# Patient Record
Sex: Male | Born: 1955 | Race: White | Hispanic: No | Marital: Married | Smoking: Never smoker
Health system: Southern US, Community
[De-identification: ages and names within clinical notes are randomized; demographics above are authoritative.]

## PROBLEM LIST (undated history)

## (undated) DIAGNOSIS — M109 Gout, unspecified: Secondary | ICD-10-CM

## (undated) DIAGNOSIS — E119 Type 2 diabetes mellitus without complications: Secondary | ICD-10-CM

## (undated) HISTORY — PX: CLAVICLE SURGERY: SHX598

---

## 2001-02-16 ENCOUNTER — Emergency Department (HOSPITAL_COMMUNITY): Admission: EM | Admit: 2001-02-16 | Discharge: 2001-02-16 | Payer: Self-pay

## 2002-07-08 ENCOUNTER — Emergency Department (HOSPITAL_COMMUNITY): Admission: EM | Admit: 2002-07-08 | Discharge: 2002-07-08 | Payer: Self-pay | Admitting: Emergency Medicine

## 2005-02-07 ENCOUNTER — Ambulatory Visit: Payer: Self-pay | Admitting: Family Medicine

## 2005-02-07 ENCOUNTER — Inpatient Hospital Stay (HOSPITAL_COMMUNITY): Admission: EM | Admit: 2005-02-07 | Discharge: 2005-02-09 | Payer: Self-pay | Admitting: Emergency Medicine

## 2005-03-13 ENCOUNTER — Ambulatory Visit: Payer: Self-pay | Admitting: Family Medicine

## 2007-04-16 ENCOUNTER — Emergency Department (HOSPITAL_COMMUNITY): Admission: EM | Admit: 2007-04-16 | Discharge: 2007-04-16 | Payer: Self-pay | Admitting: Emergency Medicine

## 2007-07-05 ENCOUNTER — Emergency Department (HOSPITAL_COMMUNITY): Admission: EM | Admit: 2007-07-05 | Discharge: 2007-07-05 | Payer: Self-pay | Admitting: Family Medicine

## 2008-08-27 ENCOUNTER — Ambulatory Visit: Admission: RE | Admit: 2008-08-27 | Discharge: 2008-08-27 | Payer: Self-pay | Admitting: Hematology & Oncology

## 2008-10-12 ENCOUNTER — Emergency Department (HOSPITAL_COMMUNITY): Admission: EM | Admit: 2008-10-12 | Discharge: 2008-10-13 | Payer: Self-pay | Admitting: Emergency Medicine

## 2010-11-14 NOTE — Procedures (Signed)
NAME:  Bryan Gibbs, Bryan Gibbs               ACCOUNT NO.:  [id number]   MEDICAL RECORD NO.:  [id number]          PATIENT TYPE:  OUT   LOCATION:  SLEE                          FACILITY:  [facility]   PHYSICIAN:  Kofi A. Gerilyn Pilgrim, M.D. DATE OF BIRTH:  [birth year]   DATE OF PROCEDURE:  08/27/2008  DATE OF DISCHARGE:  08/27/2008                             SLEEP DISORDER REPORT   NOCTURNAL POLYSOMNOGRAPHY REPORT   REFERRING PHYSICIAN:  Kinnie Scales. Annalee Genta, MD   INDICATIONS:  This is a 55 year old who presents with hypersomnia  snoring and is being evaluated for obstructive sleep apnea syndrome.   MEDICATIONS:  Allopurinol.   Epworth sleepiness scale 11.  BMI 36.   ARCHITECTURAL SUMMARY:  This is a split night study with the first part being a diagnostic and  second part a titration.  The total recording time is 447 minutes.  Sleep efficiency 83%.  Sleep latency 8 minutes.  REM latency 244  minutes.   RESPIRATORY SUMMARY:  The baseline oxygen saturation 94%.  Lowest  saturation 80%.  The diagnostic AHI is 92.  The patient was titrated  between pressures of 5 CPAP, which he initially tolerated but  subsequently had to be switched over to bilevel pressures because of  intolerance.  The optimal pressure is 18/14.   LIMB MOVEMENT SUMMARY:  PLM index zero.   ELECTROCARDIOGRAM SUMMARY:  Average heart rate 79 with no significant  dysrhythmias observed.   IMPRESSION:  Severe obstructive sleep apnea syndrome, which responded  well to a bilevel pressure of 18/14.   Thanks for this referral      Kofi A. Gerilyn Pilgrim, M.D.  Electronically Signed     KAD/MEDQ  D:  09/04/2008  T:  09/05/2008  Job:  [id number]
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
## 2010-11-17 NOTE — Discharge Summary (Signed)
NAME:  Bryan Gibbs, Bryan Gibbs               ACCOUNT NO.:  [id number]   MEDICAL RECORD NO.:  [id number]          PATIENT TYPE:  INP   LOCATION:  5729                         FACILITY:  [facility]   PHYSICIAN:  Henri Medal, MDDATE OF BIRTH:  February 08, 1956   DATE OF ADMISSION:  02/07/2005  DATE OF DISCHARGE:  02/09/2005                                 DISCHARGE SUMMARY   DISCHARGE DIAGNOSES:  1.  Partial small bowel obstruction.  2.  Obstructive sleep apnea.   DISCHARGE MEDICATIONS:  Tylenol 500 mg q.4-6h. p.r.n. pain.   PROCEDURES:  1.  Abdominal CT:  Mild paroxysmal mid small bowel distention with relative      decompression of the distal small bowel and colon.  Nonfocal transition      point is identified.  Findings may reflect a partial small bowel      obstruction or focal ileus.  No intra-abdominal inflammatory changes      identified.  The patient's gallbladder was not visualized.  2.  Pelvic CT:  No focal peri-inflammatory changes or masses.   HOSPITAL COURSE:  A 55 year old white male with a two day history of  diarrhea, nonbloody, nonmucoid, non-foul-smelling.  The patient also  complained of persistent vomiting which started after the diarrhea and  epigastric abdominal pain described as dull, aching, constant, severity  8/10, worse on laying flat and on eating.  The pain radiated to the  supraumbilical area, both on the left and the right side.  Patient was  unable to keep fluids down for the past two days.  The patient also  complained of weakness, dizziness, and lightheadedness.  The patient was  seen at [facility] and noted to be orthostatic and  tachycardic with diffuse abdominal tenderness.  Patient was sent to [facility] emergency for evaluation.   PHYSICAL EXAMINATION:  VITAL SIGNS:  Blood pressure 136/90, heart rate 106,  respiratory rate 20, O2 sat 93% on room air.  GENERAL:  The patient was in no acute distress.  Oriented x3.  ABDOMEN:  Decreased  bowel sounds with tenderness on deep palpation in the  epigastric area, right lower quadrant with question of rebound tenderness.  No guarding.  No masses palpable.  No psoas or obturator tenderness.   LABORATORY ON ADMISSION:  Hemoglobin 18.7, hematocrit 52.1, WBC 6.2,  platelets 239.  Stool guaiac was positive.  Stool WBC was positive with (one  cell).   PROBLEM LIST:  1.  For the small bowel obstruction, an NG tube was placed, and patient was      made n.p.o.  The pain was managed with morphine IV.  Phenergan p.r.n.      for vomiting.  Abdominal CT scan showed SBO (partial versus ileus).      This clinical presentation for this patient was most likely secondary to      a viral gastroenteritis causing the SBO, partial.  Stools were initially      positive for SBO, second sample negative.  The patient responded      successfully with medical management of SBO.  On the day  of discharge,      the patient was stable, tolerating p.o. intake.  Denied nausea,      vomiting, diarrhea, abdominal pain.  The patient was DC'd home with a      follow-up appointment with Dr. Tanya Nones.  Advised to consult GI MD, if      symptoms recur.  2.  Dehydration:  Secondary to vomiting, diarrhea, and decreased p.o. intake      for two days, resolved with IV fluid therapy.  3.  Obstructive sleep apnea:  The patient refused to use oxygen during the      night.  This issue to be followed by primary physician.    Lendon Colonel  D:  02/09/2005  T:  02/09/2005  Job:  [id number]   cc:   Broadus John T. Pamalee Leyden, MD  Fax: [phone]   Stan Head. Cleta Alberts, M.D.  [address]  [place]  [place] [address]  Fax: [phone]

## 2010-11-17 NOTE — H&P (Signed)
NAME:  Bryan Gibbs, Bryan Gibbs               ACCOUNT NO.:  [id number]   MEDICAL RECORD NO.:  [id number]          PATIENT TYPE:  INP   LOCATION:  1824                         FACILITY:  [facility]   PHYSICIAN:  Leighton Roach McDiarmid, M.D.DATE OF BIRTH:  October 14, 1955   DATE OF ADMISSION:  02/07/2005  DATE OF DISCHARGE:                                HISTORY & PHYSICAL   HISTORY OF PRESENT ILLNESS:  This is a 55 year old white male who presents  with a 2-day history of diarrhea, soft to watery stools described as  yellowish, non-bloody, non-mucoid, non-foul smelling.  The stools initially  were occurring every hour.  This was followed by persistent vomiting of  watery to semi-solid non-bilious material and epigastric abdominal pain  described as dull, aching, constant, with severity of 8/10, worse on lying  flat and on eating.  The pain was noted to radiate to the supraumbilical  area, both on the left and the right side.  The patient was unable to keep  down any fluids the past 2 days, except for a half cup of orange juice.  The  patient felt weak, dizzy, and lightheaded.  He was seen at the [facility] and noted to be orthostatic and tachycardic with  diffuse abdominal tenderness.  The patient was sent to the [facility]  emergency department for evaluation.   PAST MEDICAL HISTORY:  1.  Gout.  2.  Obstructive sleep apnea on CPAP at night.   MEDICATIONS:  Gout medication, which the patient cannot recall.   ALLERGIES:  No known drug allergies.   FAMILY HISTORY:  Coronary artery disease in the father who died at 84 years  of age because of a myocardial infarction.  Hypertension in the mother.  CVA  in both grandparents.   SOCIAL HISTORY:  The patient is married with 3 children.  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  The patient has occasional alcohol intake.   REVIEW OF SYSTEMS:  There is no history of fever, hematochezia, melena,  jaundice, dysuria, frequency, recent  antibiotic use.   PHYSICAL EXAMINATION:  VITAL SIGNS:  Blood pressure of 136/90, heart rate of  106, respiratory rate 20, O2 saturation of 93% on room air.  GENERAL:  This is a pleasant white male lying on the bed, comfortable, in no  distress.  HEENT:  Pink conjunctivae.  Anicteric sclerae.  No tonsillar or pharyngeal  congestion with note of dry oral mucosa.  NECK:  No thyromegaly.  No cervical lymph.  CVS:  __________.  Distinct heart sounds.  Tachycardic.  No murmurs.  RESPIRATORY:  Clear to auscultation bilaterally.  No crackles, no wheezes.  ABDOMEN:  There is a note of decreased bowel sounds with tenderness on deep  palpation in the epigastric area in the right lower quadrant with  questionable rebound tenderness.  No guarding.  No psoas or obturator  tenderness.  No masses palpable.  EXTREMITIES:  No edema, cyanosis, with warm, well-perfused skin.  NEUROLOGIC:  Cranial nerves II-XII intact.  Motor strength 5/5 in both upper  and lower extremities.  Sensory  intact.   LABORATORY DATA:  Hemoglobin 18.7, hematocrit 52.1, WBC's 6.2, platelets  239.  Stool guaiac positive.  Stool WBC positive, 1 cell.   ASSESSMENT:  A 55 year old white male admitted for vomiting and diarrhea  with dehydration and abdominal pain.  1.  Diarrhea and vomiting, probably secondary to acute gastroenteritis with      dehydration.  Will given IV bolus and maintain IV fluids.  Will check      stool cultures and CMET.  Will give patient Phenergan 25 mg p.r.n. for      vomiting.  2.  Abdominal pain.  This is probably secondary to acute gastroenteritis.      However, on examination, there is some localization of the pain to the      epigastric area.  Concern for possible peptic ulcer, pancreatitis, or      gastritis.  Will check amylase, lipase, and do an abdominal series to      rule out perforated ulcer.  At this point, there is no indication to do      a CT of the abdomen; however, if the patient's abdominal  findings      progress in severity, we will need to evaluate the patient and assess      the need for further imaging.  3.  Obstructive sleep apnea.  Continue CPAP at home settings.  4.  Fluids, electrolytes, and nutrition.  NPO temporarily.  IVF NSS 500 cc      bolus then to run at 150 cc per hour.   Discussed with the patient the need for admission.  Addressed his questions  and concerns.      M   MC/MEDQ  D:  02/07/2005  T:  02/07/2005  Job:  [id number]   cc:   Brett Canales A. Cleta Alberts, M.D.  [address]  [place]  [place] [address]  Fax: [phone]

## 2011-04-11 LAB — URINALYSIS, ROUTINE W REFLEX MICROSCOPIC
Bilirubin Urine: NEGATIVE
Ketones, ur: 15 — AB
Nitrite: NEGATIVE
Protein, ur: NEGATIVE
Specific Gravity, Urine: 1.023
Urobilinogen, UA: 0.2

## 2011-04-11 LAB — URINE MICROSCOPIC-ADD ON

## 2013-11-23 ENCOUNTER — Encounter (HOSPITAL_COMMUNITY): Payer: Self-pay | Admitting: Emergency Medicine

## 2013-11-23 ENCOUNTER — Emergency Department (HOSPITAL_COMMUNITY)
Admission: EM | Admit: 2013-11-23 | Discharge: 2013-11-23 | Disposition: A | Discharge status: Home / Self Care | Payer: BC Managed Care – PPO | Attending: Emergency Medicine | Admitting: Emergency Medicine

## 2013-11-23 ENCOUNTER — Emergency Department (HOSPITAL_COMMUNITY): Payer: BC Managed Care – PPO

## 2013-11-23 DIAGNOSIS — X58XXXA Exposure to other specified factors, initial encounter: Secondary | ICD-10-CM | POA: Insufficient documentation

## 2013-11-23 DIAGNOSIS — S60229A Contusion of unspecified hand, initial encounter: Secondary | ICD-10-CM | POA: Insufficient documentation

## 2013-11-23 DIAGNOSIS — E119 Type 2 diabetes mellitus without complications: Secondary | ICD-10-CM | POA: Insufficient documentation

## 2013-11-23 DIAGNOSIS — T148XXA Other injury of unspecified body region, initial encounter: Secondary | ICD-10-CM

## 2013-11-23 DIAGNOSIS — Y93H2 Activity, gardening and landscaping: Secondary | ICD-10-CM | POA: Insufficient documentation

## 2013-11-23 DIAGNOSIS — Y92009 Unspecified place in unspecified non-institutional (private) residence as the place of occurrence of the external cause: Secondary | ICD-10-CM | POA: Insufficient documentation

## 2013-11-23 HISTORY — DX: Gout, unspecified: M10.9

## 2013-11-23 HISTORY — DX: Type 2 diabetes mellitus without complications: E11.9

## 2013-11-23 NOTE — ED Provider Notes (Signed)
CSN: [id number]     Arrival date & time 11/23/13  1713 History  This chart was scribed for non-physician practitioner Kerrie Buffalo, NP, working with Gilda Crease, MD, by Yevette Edwards, ED Scribe. This patient was seen in room APFT22/APFT22 and the patient's care was started at 6:40 PM.   None    Chief Complaint  Patient presents with  . Hand Pain   Patient is a 58 y.o. male presenting with hand pain. The history is provided by the patient. No language interpreter was used.  Hand Pain This is a new problem. The current episode started 3 to 5 hours ago. The problem occurs constantly. The problem has not changed since onset.Nothing aggravates the symptoms. Nothing relieves the symptoms. He has tried a cold compress for the symptoms.   HPI Comments: PENN ALKINS is a 58 y.o. male, with a h/o gout and DM, who presents to the Emergency Department complaining of acute swelling to his right hand which began today. He states he has not experienced pain to the hand except for when pressure is applied. The pt was working outside today, but he denies known bites or injuries to his hand. He did wash 2 cars and used a tiller to plant his garden.  He has used ice with minimal resolution. The pt is a truck-driver by trade.   Past Medical History  Diagnosis Date  . Gout   . Diabetes mellitus without complication    History reviewed. No pertinent past surgical history. History reviewed. No pertinent family history. History  Substance Use Topics  . Smoking status: Never Smoker   . Smokeless tobacco: Not on file  . Alcohol Use: No    Review of Systems  negative except as stated in HPI  Allergies  Review of patient's allergies indicates no known allergies.  Home Medications   Prior to Admission medications   Not on File   Physical Exam  Nursing note and vitals reviewed. Constitutional: He is oriented to person, place, and time. He appears well-developed and well-nourished. No  distress.  HENT:  Head: Normocephalic and atraumatic.  Eyes: EOM are normal.  Neck: Neck supple. No tracheal deviation present.  Cardiovascular: Normal rate.   Good pulses and good circulation.   Pulmonary/Chest: Effort normal. No respiratory distress.  Musculoskeletal: Normal range of motion.       Right hand: He exhibits tenderness and swelling. He exhibits normal range of motion, no bony tenderness, normal capillary refill and no laceration. Normal sensation noted. Normal strength noted.       Hands: Neurological: He is alert and oriented to person, place, and time.  Good strength and grip.   Skin: Skin is warm and dry. No erythema.  No warmth present to right hand. No erythema present to right hand.   Psychiatric: He has a normal mood and affect. His behavior is normal.    ED Course  Procedures (including critical care time)   COORDINATION OF CARE:  6:53 PM- Discussed treatment plan with patient, and the patient agreed to the plan. The plan includes usage of IBU and continual use of ice, wrap, and elevation. Advised pt to return to ED if he develops numbness to his fingers.   Labs Review Labs Reviewed - No data to display  Imaging Review Dg Hand Complete Right  11/23/2013   CLINICAL DATA:  Hand swelling and pain.  No known injury.  EXAM: RIGHT HAND - COMPLETE 3+ VIEW  COMPARISON:  None.  FINDINGS: No fracture  or dislocation.  There arthropathic changes most evident involving the DIP joints, greatest at the index finger, with asymmetric joint space narrowing, minor subchondral sclerosis and small marginal osteophytes.  There is mild soft tissue swelling of the fingers adjacent to the DIP joints.  IMPRESSION: No fracture or acute finding.  Osteoarthritis.   Electronically Signed   By: Amie Portlandavid  Ormond M.D.   On: 11/23/2013 17:42    MDM  58 y.o. male with swelling and bruising to the dorsum of the right hand. Ace wrap applied, ice, elevation and Advil. Stable for discharge without  neurovascular deficits. He will return for any problems. Discussed in detail signs of vascular compromise and reasons to return. Patient voices understanding.   Final diagnoses:  Hematoma   I personally performed the services described in this documentation, which was scribed in my presence. The recorded information has been reviewed and is accurate.   [employer] G [employer] LLCope Orlene OchM Humberto Addo, [place]NP 11/24/13 0025

## 2013-11-23 NOTE — ED Notes (Addendum)
Swelling of rt hand , had been outside working and felt  Burning sensation, , Lg swelling present.  Ice pack applied

## 2013-11-26 NOTE — ED Provider Notes (Signed)
Medical screening examination/treatment/procedure(s) were performed by non-physician practitioner and as supervising physician I was immediately available for consultation/collaboration.   EKG Interpretation None        Christopher J. Pollina, MD 11/26/13 1501 

## 2016-06-22 DIAGNOSIS — R0602 Shortness of breath: Secondary | ICD-10-CM | POA: Diagnosis not present

## 2016-06-22 DIAGNOSIS — R05 Cough: Secondary | ICD-10-CM | POA: Diagnosis not present

## 2016-06-22 DIAGNOSIS — J189 Pneumonia, unspecified organism: Secondary | ICD-10-CM | POA: Diagnosis not present

## 2016-07-01 DIAGNOSIS — R05 Cough: Secondary | ICD-10-CM | POA: Diagnosis not present

## 2016-07-01 DIAGNOSIS — J018 Other acute sinusitis: Secondary | ICD-10-CM | POA: Diagnosis not present

## 2016-08-14 DIAGNOSIS — G4733 Obstructive sleep apnea (adult) (pediatric): Secondary | ICD-10-CM | POA: Diagnosis not present

## 2016-08-14 DIAGNOSIS — E1165 Type 2 diabetes mellitus with hyperglycemia: Secondary | ICD-10-CM | POA: Diagnosis not present

## 2016-08-14 DIAGNOSIS — E782 Mixed hyperlipidemia: Secondary | ICD-10-CM | POA: Diagnosis not present

## 2016-08-14 DIAGNOSIS — I10 Essential (primary) hypertension: Secondary | ICD-10-CM | POA: Diagnosis not present

## 2016-08-16 DIAGNOSIS — E782 Mixed hyperlipidemia: Secondary | ICD-10-CM | POA: Diagnosis not present

## 2016-08-16 DIAGNOSIS — E1165 Type 2 diabetes mellitus with hyperglycemia: Secondary | ICD-10-CM | POA: Diagnosis not present

## 2016-08-16 DIAGNOSIS — M1000 Idiopathic gout, unspecified site: Secondary | ICD-10-CM | POA: Diagnosis not present

## 2016-08-16 DIAGNOSIS — G4733 Obstructive sleep apnea (adult) (pediatric): Secondary | ICD-10-CM | POA: Diagnosis not present

## 2016-11-06 DIAGNOSIS — H6122 Impacted cerumen, left ear: Secondary | ICD-10-CM | POA: Diagnosis not present

## 2016-11-06 DIAGNOSIS — Z6832 Body mass index (BMI) 32.0-32.9, adult: Secondary | ICD-10-CM | POA: Diagnosis not present

## 2016-11-06 DIAGNOSIS — H9202 Otalgia, left ear: Secondary | ICD-10-CM | POA: Diagnosis not present

## 2016-12-18 DIAGNOSIS — E782 Mixed hyperlipidemia: Secondary | ICD-10-CM | POA: Diagnosis not present

## 2016-12-18 DIAGNOSIS — R5383 Other fatigue: Secondary | ICD-10-CM | POA: Diagnosis not present

## 2016-12-18 DIAGNOSIS — E1165 Type 2 diabetes mellitus with hyperglycemia: Secondary | ICD-10-CM | POA: Diagnosis not present

## 2016-12-18 DIAGNOSIS — I10 Essential (primary) hypertension: Secondary | ICD-10-CM | POA: Diagnosis not present

## 2016-12-18 DIAGNOSIS — Z9189 Other specified personal risk factors, not elsewhere classified: Secondary | ICD-10-CM | POA: Diagnosis not present

## 2016-12-24 DIAGNOSIS — Z1389 Encounter for screening for other disorder: Secondary | ICD-10-CM | POA: Diagnosis not present

## 2016-12-24 DIAGNOSIS — G4733 Obstructive sleep apnea (adult) (pediatric): Secondary | ICD-10-CM | POA: Diagnosis not present

## 2016-12-24 DIAGNOSIS — E782 Mixed hyperlipidemia: Secondary | ICD-10-CM | POA: Diagnosis not present

## 2016-12-24 DIAGNOSIS — M1000 Idiopathic gout, unspecified site: Secondary | ICD-10-CM | POA: Diagnosis not present

## 2016-12-24 DIAGNOSIS — E1165 Type 2 diabetes mellitus with hyperglycemia: Secondary | ICD-10-CM | POA: Diagnosis not present

## 2017-06-06 DIAGNOSIS — I10 Essential (primary) hypertension: Secondary | ICD-10-CM | POA: Diagnosis not present

## 2017-06-06 DIAGNOSIS — E782 Mixed hyperlipidemia: Secondary | ICD-10-CM | POA: Diagnosis not present

## 2017-06-06 DIAGNOSIS — E1165 Type 2 diabetes mellitus with hyperglycemia: Secondary | ICD-10-CM | POA: Diagnosis not present

## 2017-06-06 DIAGNOSIS — M1000 Idiopathic gout, unspecified site: Secondary | ICD-10-CM | POA: Diagnosis not present

## 2017-06-13 DIAGNOSIS — I10 Essential (primary) hypertension: Secondary | ICD-10-CM | POA: Diagnosis not present

## 2017-06-13 DIAGNOSIS — E1165 Type 2 diabetes mellitus with hyperglycemia: Secondary | ICD-10-CM | POA: Diagnosis not present

## 2017-06-13 DIAGNOSIS — E782 Mixed hyperlipidemia: Secondary | ICD-10-CM | POA: Diagnosis not present

## 2017-06-13 DIAGNOSIS — Z23 Encounter for immunization: Secondary | ICD-10-CM | POA: Diagnosis not present

## 2017-06-13 DIAGNOSIS — M1000 Idiopathic gout, unspecified site: Secondary | ICD-10-CM | POA: Diagnosis not present

## 2017-06-13 DIAGNOSIS — Z0001 Encounter for general adult medical examination with abnormal findings: Secondary | ICD-10-CM | POA: Diagnosis not present

## 2017-06-13 DIAGNOSIS — Z1212 Encounter for screening for malignant neoplasm of rectum: Secondary | ICD-10-CM | POA: Diagnosis not present

## 2017-10-15 DIAGNOSIS — E1165 Type 2 diabetes mellitus with hyperglycemia: Secondary | ICD-10-CM | POA: Diagnosis not present

## 2017-10-15 DIAGNOSIS — M1000 Idiopathic gout, unspecified site: Secondary | ICD-10-CM | POA: Diagnosis not present

## 2017-10-15 DIAGNOSIS — G4733 Obstructive sleep apnea (adult) (pediatric): Secondary | ICD-10-CM | POA: Diagnosis not present

## 2017-10-15 DIAGNOSIS — E782 Mixed hyperlipidemia: Secondary | ICD-10-CM | POA: Diagnosis not present

## 2018-02-11 DIAGNOSIS — R5383 Other fatigue: Secondary | ICD-10-CM | POA: Diagnosis not present

## 2018-02-11 DIAGNOSIS — I10 Essential (primary) hypertension: Secondary | ICD-10-CM | POA: Diagnosis not present

## 2018-02-11 DIAGNOSIS — E782 Mixed hyperlipidemia: Secondary | ICD-10-CM | POA: Diagnosis not present

## 2018-02-11 DIAGNOSIS — Z1212 Encounter for screening for malignant neoplasm of rectum: Secondary | ICD-10-CM | POA: Diagnosis not present

## 2018-02-11 DIAGNOSIS — E1165 Type 2 diabetes mellitus with hyperglycemia: Secondary | ICD-10-CM | POA: Diagnosis not present

## 2018-02-18 DIAGNOSIS — G4733 Obstructive sleep apnea (adult) (pediatric): Secondary | ICD-10-CM | POA: Diagnosis not present

## 2018-02-18 DIAGNOSIS — E782 Mixed hyperlipidemia: Secondary | ICD-10-CM | POA: Diagnosis not present

## 2018-02-18 DIAGNOSIS — M1000 Idiopathic gout, unspecified site: Secondary | ICD-10-CM | POA: Diagnosis not present

## 2018-02-18 DIAGNOSIS — Z23 Encounter for immunization: Secondary | ICD-10-CM | POA: Diagnosis not present

## 2018-02-18 DIAGNOSIS — E1165 Type 2 diabetes mellitus with hyperglycemia: Secondary | ICD-10-CM | POA: Diagnosis not present

## 2018-06-16 DIAGNOSIS — Z0001 Encounter for general adult medical examination with abnormal findings: Secondary | ICD-10-CM | POA: Diagnosis not present

## 2018-07-29 DIAGNOSIS — Z6831 Body mass index (BMI) 31.0-31.9, adult: Secondary | ICD-10-CM | POA: Diagnosis not present

## 2018-07-29 DIAGNOSIS — K602 Anal fissure, unspecified: Secondary | ICD-10-CM | POA: Diagnosis not present

## 2019-02-23 ENCOUNTER — Encounter (HOSPITAL_COMMUNITY): Payer: Self-pay | Admitting: Emergency Medicine

## 2019-02-23 ENCOUNTER — Emergency Department (HOSPITAL_COMMUNITY): Payer: Worker's Compensation

## 2019-02-23 ENCOUNTER — Emergency Department (HOSPITAL_COMMUNITY)
Admission: EM | Admit: 2019-02-23 | Discharge: 2019-02-23 | Disposition: A | Discharge status: Home / Self Care | Payer: Worker's Compensation | Attending: Emergency Medicine | Admitting: Emergency Medicine

## 2019-02-23 DIAGNOSIS — M542 Cervicalgia: Secondary | ICD-10-CM | POA: Diagnosis not present

## 2019-02-23 DIAGNOSIS — W19XXXA Unspecified fall, initial encounter: Secondary | ICD-10-CM

## 2019-02-23 DIAGNOSIS — Y9289 Other specified places as the place of occurrence of the external cause: Secondary | ICD-10-CM | POA: Diagnosis not present

## 2019-02-23 DIAGNOSIS — E119 Type 2 diabetes mellitus without complications: Secondary | ICD-10-CM | POA: Insufficient documentation

## 2019-02-23 DIAGNOSIS — Y9389 Activity, other specified: Secondary | ICD-10-CM | POA: Insufficient documentation

## 2019-02-23 DIAGNOSIS — Y990 Civilian activity done for income or pay: Secondary | ICD-10-CM | POA: Diagnosis not present

## 2019-02-23 DIAGNOSIS — R51 Headache: Secondary | ICD-10-CM | POA: Diagnosis not present

## 2019-02-23 DIAGNOSIS — Y93H3 Activity, building and construction: Secondary | ICD-10-CM

## 2019-02-23 MED ORDER — IBUPROFEN 400 MG PO TABS
400.0000 mg | ORAL_TABLET | Freq: Once | ORAL | Status: AC
  Administered 2019-02-23: 400 mg via ORAL
  Filled 2019-02-23: qty 1

## 2019-02-23 MED ORDER — ACETAMINOPHEN 500 MG PO TABS
1000.0000 mg | ORAL_TABLET | Freq: Once | ORAL | Status: AC
  Administered 2019-02-23: 1000 mg via ORAL
  Filled 2019-02-23: qty 2

## 2019-02-23 NOTE — ED Notes (Signed)
Patient transported to CT 

## 2019-02-23 NOTE — ED Notes (Signed)
Pt verbalizes understanding of d/c instructions. Pt ambulatory at d/c with all belongings.   

## 2019-02-23 NOTE — ED Notes (Signed)
Called CT to see where pt is in line, two pt in front of him.

## 2019-02-23 NOTE — ED Provider Notes (Signed)
[facility]   CSN: [id number] Arrival date & time: 02/23/19  1311     History   Chief Complaint Chief Complaint  Patient presents with  . Fall    HPI Bryan Gibbs is a 63 y.o. male presents to the ER for evaluation of fall that occurred during work.  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  He landed on his upper back, posterior head.  States he was a little "dazed" for a few seconds after the fall but did not lose consciousness.  He felt the back of his head and had some small amount of blood that he was able to wipe off.  He reports mild to moderate "discomfort" that he attributes to muscle soreness that is located in between his shoulder blades, base of his neck and posterior neck.  This is very mild and a 3/10, constant but worse with movement and palpation.  No interventions.  No alleviating factors.  He denies any anticoagulant use.  He denies any LOC, vomiting, dizziness, vision changes or loss, distal tingling numbness or weakness.  No other physical injuries.  He denies any chest pain, shortness of breath, abdominal pain, lower back pain.     HPI  Past Medical History:  Diagnosis Date  . Diabetes mellitus without complication (HCC)   . Gout     There are no active problems to display for this patient.   History reviewed. No pertinent surgical history.      Home Medications    Prior to Admission medications   Not on File    Family History No family history on file.  Social History Social History   Tobacco Use  . Smoking status: Never Smoker  . Smokeless tobacco: Never Used  Substance Use Topics  . Alcohol use: No  . Drug use: No     Allergies   Patient has no known allergies.   Review of Systems Review of Systems  Musculoskeletal: Positive for neck pain.   Posterior head pain   All other systems reviewed and are negative.    Physical Exam Updated Vital Signs BP 123/76 (BP Location: Right Arm)   Pulse 99   Temp 98 F (36.7 C) (Oral)   Resp 16   Ht 5\' 11"  (1.803 m)   Wt 90.7 kg   SpO2 96%   BMI 27.89 kg/m   Physical Exam Vitals signs and nursing note reviewed.  Constitutional:      General: He is not in acute distress.    Appearance: He is well-developed.     Comments: NAD.  HENT:     Head: Normocephalic.     Comments: 1 cm straight abrasion to the occipital prominence, nontender.  Hemostatic.  No scalp bone tenderness, hematoma, ecchymosis.    Right Ear: External ear normal.     Left Ear: External ear normal.     Nose: Nose normal.  Eyes:     General: No scleral icterus.    Conjunctiva/sclera: Conjunctivae normal.  Neck:     Musculoskeletal: Normal range of motion and neck supple.     Comments: C-spine: Palpation of midline and paraspinal muscles does not reproduce or cause pain.  Patient states that it is sore but palpation does not make it any worse.  Full range of motion of the pain, mild discomfort with  right and left neck bend and rotation.  Trachea is midline.  No carotid bruits. Cardiovascular:     Rate and Rhythm: Normal rate and regular rhythm.     Heart sounds: Normal heart sounds.  Pulmonary:     Effort: Pulmonary effort is normal.     Breath sounds: Normal breath sounds.  Musculoskeletal: Normal range of motion.     Comments: TL spine: No midline or paraspinal muscle tenderness.  No skin injuries, ecchymosis, abrasions to the thoracic or lumbar back.  Skin:    General: Skin is warm and dry.     Capillary Refill: Capillary refill takes less than 2 seconds.  Neurological:     Mental Status: He is alert and oriented to person, place, and time.     Comments:  Alert and oriented to self, place, time and event.  Speech is fluent without dysarthria or dysphasia. Strength 5/5 with hand grip and ankle F/E.    Sensation to light touch intact in face, hands and feet. Normal gait/sits on side of the bed without truncal sway No pronator drift. No leg drop. Normal finger-to-nose and feet tapping.  CN I not tested CN II grossly intact visual fields bilaterally. Unable to visualize posterior eye. CN III, IV, VI PEERL and EOMs intact bilaterally CN V light touch intact in all 3 divisions of trigeminal nerve CN VII facial movements symmetric CN VIII not tested CN IX, X no uvula deviation, symmetric rise of soft palate  CN XI 5/5 SCM and trapezius strength bilaterally  CN XII Midline tongue protrusion, symmetric L/R movements  Psychiatric:        Behavior: Behavior normal.        Thought Content: Thought content normal.        Judgment: Judgment normal.      ED Treatments / Results  Labs (all labs ordered are listed, but only abnormal results are displayed) Labs Reviewed - No data to display  EKG None  Radiology Ct Head Wo Contrast  Result Date: 02/23/2019 CLINICAL DATA:  5-6 foot fall with posterior head trauma and neck pain. EXAM: CT HEAD WITHOUT CONTRAST CT CERVICAL SPINE WITHOUT CONTRAST TECHNIQUE: Multidetector CT imaging of the head and cervical spine was performed following the standard protocol without intravenous contrast. Multiplanar CT image reconstructions of the cervical spine were also generated. COMPARISON:  10/13/2008 FINDINGS: CT HEAD FINDINGS Brain: Focal linear density in the left temporo-occipital region along the superior aspect of the tentorium (series 3, image 11) also appears to have been present on the prior CT and is therefore not felt to reflect subarachnoid hemorrhage but instead may be vascular. There is no evidence of intracranial hemorrhage elsewhere. No acute infarct, mass, or extra-axial fluid collection is identified. The ventricles and sulci are normal in size. Vascular: Calcified atherosclerosis at the skull base. No hyperdense vessel. Skull: No fracture or focal  osseous lesion. Sinuses/Orbits: Unremarkable orbits. Visualized paranasal sinuses and mastoid air cells are clear. Other: None. CT CERVICAL SPINE FINDINGS Alignment: Normal. Skull base and vertebrae: No fracture or suspicious osseous lesion. Soft tissues and spinal canal: No prevertebral fluid or swelling. No visible canal hematoma. Disc levels: Mild cervical spondylosis. No evidence of high-grade stenosis. Upper chest: Clear lung apices. Other: None. IMPRESSION: 1. No evidence of acute intracranial abnormality. 2. No evidence of acute fracture or subluxation in the cervical spine. Electronically Signed   By: Sebastian AcheAllen  Grady M.D.   On: 02/23/2019 17:39   Ct Cervical Spine Wo Contrast  Result Date:  02/23/2019 CLINICAL DATA:  5-6 foot fall with posterior head trauma and neck pain. EXAM: CT HEAD WITHOUT CONTRAST CT CERVICAL SPINE WITHOUT CONTRAST TECHNIQUE: Multidetector CT imaging of the head and cervical spine was performed following the standard protocol without intravenous contrast. Multiplanar CT image reconstructions of the cervical spine were also generated. COMPARISON:  10/13/2008 FINDINGS: CT HEAD FINDINGS Brain: Focal linear density in the left temporo-occipital region along the superior aspect of the tentorium (series 3, image 11) also appears to have been present on the prior CT and is therefore not felt to reflect subarachnoid hemorrhage but instead may be vascular. There is no evidence of intracranial hemorrhage elsewhere. No acute infarct, mass, or extra-axial fluid collection is identified. The ventricles and sulci are normal in size. Vascular: Calcified atherosclerosis at the skull base. No hyperdense vessel. Skull: No fracture or focal osseous lesion. Sinuses/Orbits: Unremarkable orbits. Visualized paranasal sinuses and mastoid air cells are clear. Other: None. CT CERVICAL SPINE FINDINGS Alignment: Normal. Skull base and vertebrae: No fracture or suspicious osseous lesion. Soft tissues and spinal  canal: No prevertebral fluid or swelling. No visible canal hematoma. Disc levels: Mild cervical spondylosis. No evidence of high-grade stenosis. Upper chest: Clear lung apices. Other: None. IMPRESSION: 1. No evidence of acute intracranial abnormality. 2. No evidence of acute fracture or subluxation in the cervical spine. Electronically Signed   By: Logan Bores M.D.   On: 02/23/2019 17:39    Procedures Procedures (including critical care time)  Medications Ordered in ED Medications  acetaminophen (TYLENOL) tablet 1,000 mg (1,000 mg Oral Given 02/23/19 1558)  ibuprofen (ADVIL) tablet 400 mg (400 mg Oral Given 02/23/19 1558)     Initial Impression / Assessment and Plan / ED Course  I have reviewed the triage vital signs and the nursing notes.  Pertinent labs & imaging results that were available during my care of the patient were reviewed by me and considered in my medical decision making (see chart for details).  63 y.o. yo male here after mechanical fall.  Fall of 6 feet directly on his base of the neck and posterior scalp.  HD stable on arrival.  Alert.  He has mild soreness to the neck and posterior head.  Although exam is very benign and signs of scalp defect, midline C-spine tenderness height of fall and age are red flags.  Discussed low suspicion for intracranial traumatic injury or injury C-spine with patient but given height CT head/c-spine has been ordered. This is a work related injury.  Patient comfortable with this. Will give ibuprofen/tylenol. Pending imaging.   C-spine, Head CT is normal.  Symptomatic tx and f/u with PCP for re-evaluation for persistent symptoms. Pt in agreement with ER tx and discharge plan. Return precautions given. He does not want to miss work and if symptoms mild should be able to return tomorrow without restrictions.  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

## 2019-02-23 NOTE — ED Triage Notes (Signed)
Pt reports having a fall at work today. 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 Back of scalp scraped. Reports both shoulder and back pain. Pain 4/10.

## 2019-02-23 NOTE — Discharge Instructions (Signed)
You were seen in the ER for fall with posterior neck and head pain.  CT scan today did not show any acute traumatic injuries to the head or cervical spine.  I suspect your pain and soreness is from muscular spasm, injury or strain.  For pain and inflammation you can use a combination of ibuprofen and acetaminophen.  Take [phone] mg acetaminophen (tylenol) every 6 hours or 600 mg ibuprofen (advil, motrin) every 6 hours.  You can take these separately or combine them every 6 hours for maximum pain control. Do not exceed 4,000 mg acetaminophen or 2,400 mg ibuprofen in a 24 hour period.  Do not take ibuprofen containing products if you have history of kidney disease, ulcers, GI bleeding, severe acid reflux, or take a blood thinner.  Do not take acetaminophen if you have liver disease.   Apply heat to the area and massage.

## 2019-02-23 NOTE — ED Notes (Signed)
Called CT, pt is next

## 2019-04-30 DIAGNOSIS — R5383 Other fatigue: Secondary | ICD-10-CM | POA: Diagnosis not present

## 2019-04-30 DIAGNOSIS — E1165 Type 2 diabetes mellitus with hyperglycemia: Secondary | ICD-10-CM | POA: Diagnosis not present

## 2019-04-30 DIAGNOSIS — I10 Essential (primary) hypertension: Secondary | ICD-10-CM | POA: Diagnosis not present

## 2019-04-30 DIAGNOSIS — E782 Mixed hyperlipidemia: Secondary | ICD-10-CM | POA: Diagnosis not present

## 2019-05-06 DIAGNOSIS — Z0001 Encounter for general adult medical examination with abnormal findings: Secondary | ICD-10-CM | POA: Diagnosis not present

## 2019-05-06 DIAGNOSIS — G4733 Obstructive sleep apnea (adult) (pediatric): Secondary | ICD-10-CM | POA: Diagnosis not present

## 2019-05-06 DIAGNOSIS — M1000 Idiopathic gout, unspecified site: Secondary | ICD-10-CM | POA: Diagnosis not present

## 2019-05-06 DIAGNOSIS — E1165 Type 2 diabetes mellitus with hyperglycemia: Secondary | ICD-10-CM | POA: Diagnosis not present

## 2019-05-06 DIAGNOSIS — Z23 Encounter for immunization: Secondary | ICD-10-CM | POA: Diagnosis not present

## 2019-05-06 DIAGNOSIS — E782 Mixed hyperlipidemia: Secondary | ICD-10-CM | POA: Diagnosis not present

## 2019-09-24 DIAGNOSIS — Z23 Encounter for immunization: Secondary | ICD-10-CM | POA: Diagnosis not present

## 2019-09-29 DIAGNOSIS — Z1211 Encounter for screening for malignant neoplasm of colon: Secondary | ICD-10-CM | POA: Diagnosis not present

## 2019-09-29 DIAGNOSIS — Z1212 Encounter for screening for malignant neoplasm of rectum: Secondary | ICD-10-CM | POA: Diagnosis not present

## 2019-10-03 LAB — COLOGUARD: COLOGUARD: NEGATIVE

## 2019-10-05 DIAGNOSIS — I10 Essential (primary) hypertension: Secondary | ICD-10-CM | POA: Diagnosis not present

## 2019-10-05 DIAGNOSIS — E782 Mixed hyperlipidemia: Secondary | ICD-10-CM | POA: Diagnosis not present

## 2019-10-05 DIAGNOSIS — G4733 Obstructive sleep apnea (adult) (pediatric): Secondary | ICD-10-CM | POA: Diagnosis not present

## 2019-10-05 DIAGNOSIS — E1165 Type 2 diabetes mellitus with hyperglycemia: Secondary | ICD-10-CM | POA: Diagnosis not present

## 2019-10-05 DIAGNOSIS — R5383 Other fatigue: Secondary | ICD-10-CM | POA: Diagnosis not present

## 2019-10-15 DIAGNOSIS — E782 Mixed hyperlipidemia: Secondary | ICD-10-CM | POA: Diagnosis not present

## 2019-10-15 DIAGNOSIS — M1000 Idiopathic gout, unspecified site: Secondary | ICD-10-CM | POA: Diagnosis not present

## 2019-10-15 DIAGNOSIS — G4733 Obstructive sleep apnea (adult) (pediatric): Secondary | ICD-10-CM | POA: Diagnosis not present

## 2019-10-15 DIAGNOSIS — E1165 Type 2 diabetes mellitus with hyperglycemia: Secondary | ICD-10-CM | POA: Diagnosis not present

## 2019-10-17 DIAGNOSIS — Z23 Encounter for immunization: Secondary | ICD-10-CM | POA: Diagnosis not present

## 2020-02-15 DIAGNOSIS — E782 Mixed hyperlipidemia: Secondary | ICD-10-CM | POA: Diagnosis not present

## 2020-02-15 DIAGNOSIS — E1165 Type 2 diabetes mellitus with hyperglycemia: Secondary | ICD-10-CM | POA: Diagnosis not present

## 2020-02-15 DIAGNOSIS — I10 Essential (primary) hypertension: Secondary | ICD-10-CM | POA: Diagnosis not present

## 2020-02-15 DIAGNOSIS — R5383 Other fatigue: Secondary | ICD-10-CM | POA: Diagnosis not present

## 2020-02-18 DIAGNOSIS — M1000 Idiopathic gout, unspecified site: Secondary | ICD-10-CM | POA: Diagnosis not present

## 2020-02-18 DIAGNOSIS — E1165 Type 2 diabetes mellitus with hyperglycemia: Secondary | ICD-10-CM | POA: Diagnosis not present

## 2020-02-18 DIAGNOSIS — G4733 Obstructive sleep apnea (adult) (pediatric): Secondary | ICD-10-CM | POA: Diagnosis not present

## 2020-02-18 DIAGNOSIS — E782 Mixed hyperlipidemia: Secondary | ICD-10-CM | POA: Diagnosis not present

## 2020-04-22 DIAGNOSIS — I10 Essential (primary) hypertension: Secondary | ICD-10-CM | POA: Diagnosis not present

## 2020-04-22 DIAGNOSIS — Z114 Encounter for screening for human immunodeficiency virus [HIV]: Secondary | ICD-10-CM | POA: Diagnosis not present

## 2020-04-22 DIAGNOSIS — R5383 Other fatigue: Secondary | ICD-10-CM | POA: Diagnosis not present

## 2020-04-22 DIAGNOSIS — Z1159 Encounter for screening for other viral diseases: Secondary | ICD-10-CM | POA: Diagnosis not present

## 2020-04-22 DIAGNOSIS — E1165 Type 2 diabetes mellitus with hyperglycemia: Secondary | ICD-10-CM | POA: Diagnosis not present

## 2020-04-22 DIAGNOSIS — E782 Mixed hyperlipidemia: Secondary | ICD-10-CM | POA: Diagnosis not present

## 2020-05-02 DIAGNOSIS — Z0001 Encounter for general adult medical examination with abnormal findings: Secondary | ICD-10-CM | POA: Diagnosis not present

## 2020-05-02 DIAGNOSIS — Z1212 Encounter for screening for malignant neoplasm of rectum: Secondary | ICD-10-CM | POA: Diagnosis not present

## 2020-05-02 DIAGNOSIS — I10 Essential (primary) hypertension: Secondary | ICD-10-CM | POA: Diagnosis not present

## 2020-05-02 DIAGNOSIS — Z23 Encounter for immunization: Secondary | ICD-10-CM | POA: Diagnosis not present

## 2020-05-02 DIAGNOSIS — E782 Mixed hyperlipidemia: Secondary | ICD-10-CM | POA: Diagnosis not present

## 2020-05-02 DIAGNOSIS — E1165 Type 2 diabetes mellitus with hyperglycemia: Secondary | ICD-10-CM | POA: Diagnosis not present

## 2020-05-02 DIAGNOSIS — M1000 Idiopathic gout, unspecified site: Secondary | ICD-10-CM | POA: Diagnosis not present

## 2020-05-18 DIAGNOSIS — Z23 Encounter for immunization: Secondary | ICD-10-CM | POA: Diagnosis not present

## 2020-05-31 IMAGING — CT CT HEAD WITHOUT CONTRAST
4 series · 16 of 47 positions shown, 18 images · non-contrast
Comparison: 10/13/2008

CLINICAL DATA: [DATE] foot fall with posterior head trauma and neck
pain.

EXAM:
CT HEAD WITHOUT CONTRAST
CT CERVICAL SPINE WITHOUT CONTRAST
TECHNIQUE: Multidetector CT imaging of the head and cervical spine was
performed following the standard protocol without intravenous
contrast. Multiplanar CT image reconstructions of the cervical spine
were also generated.

[Series 3: head wo · axial · 0.45mm/px · z∈[-86,+34]mm · 7 of 34 slices shown, 9 images]
[im 5/34  brain]
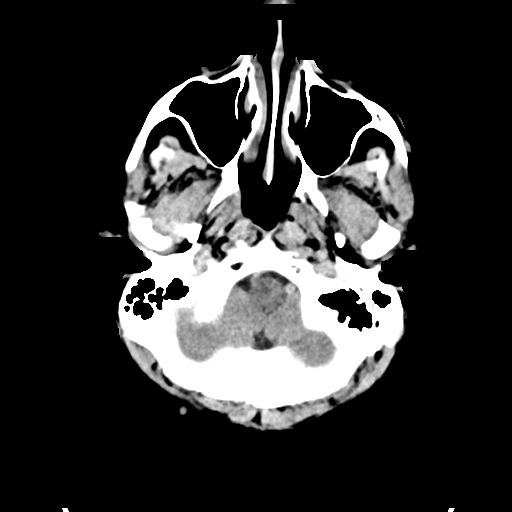
[im 5/34  bone]
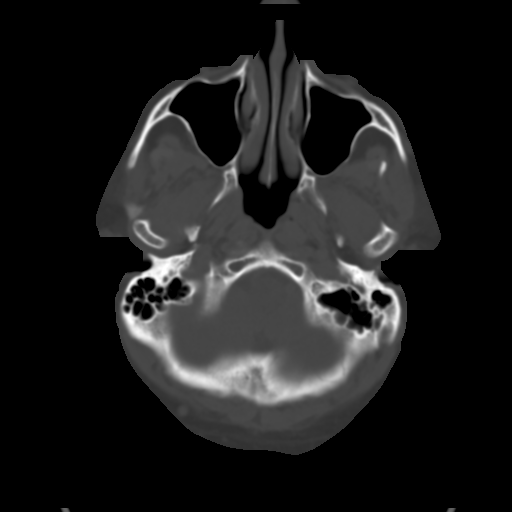
[im 9/34  brain]
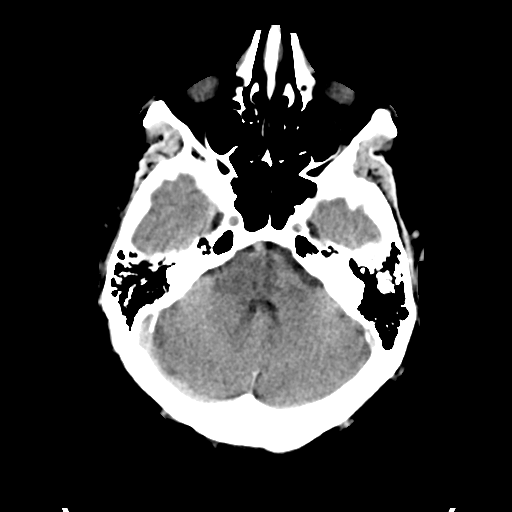
[im 13/34  brain]
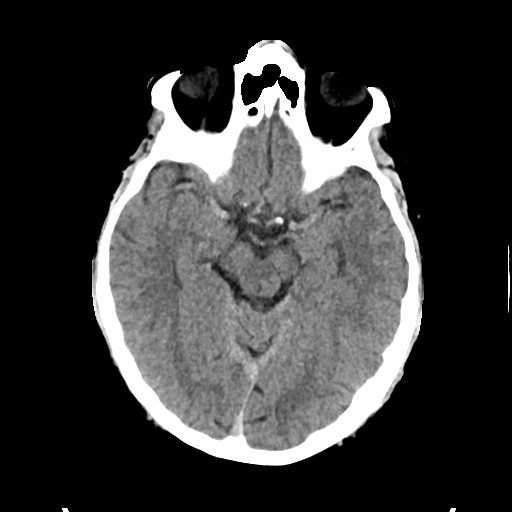
[im 17/34  brain]
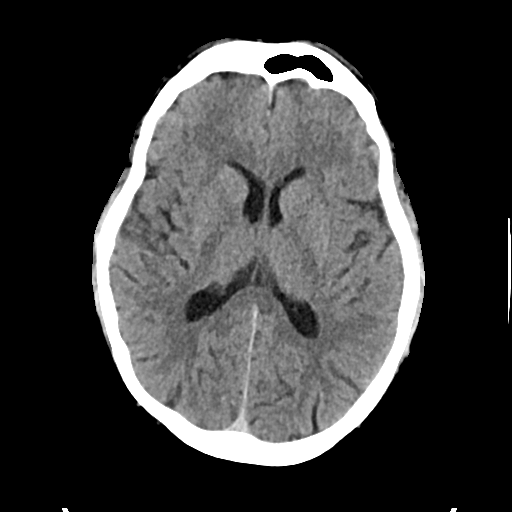
[im 21/34  brain]
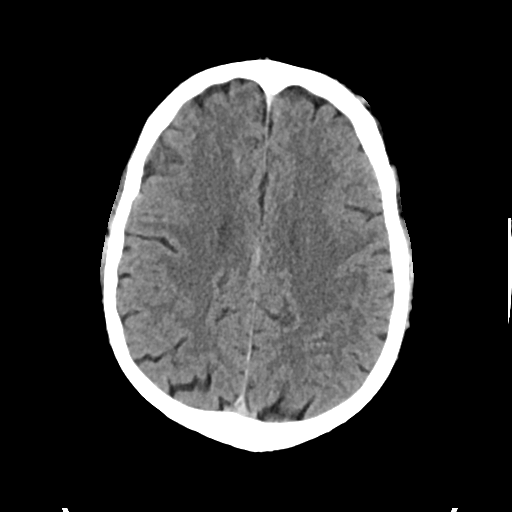
[im 21/34  bone]
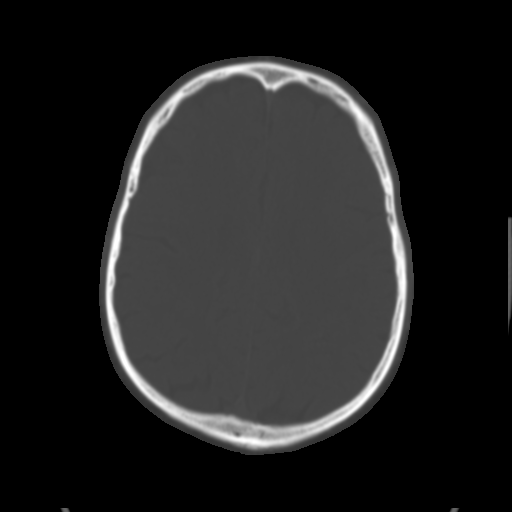
[im 25/34  brain]
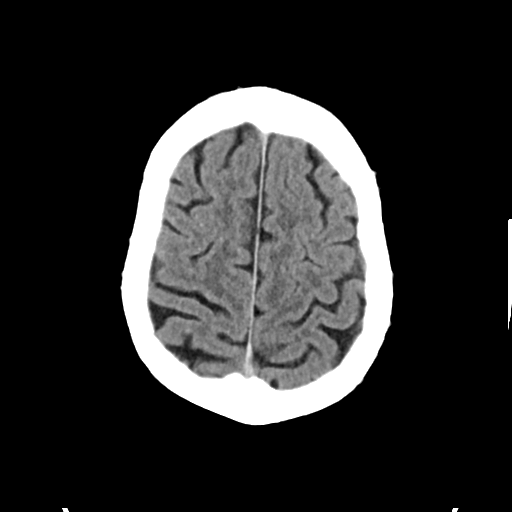
[im 29/34  brain]
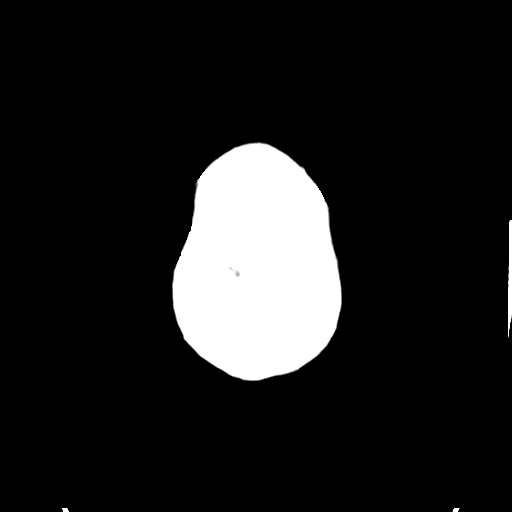

[Series 4: head bone · axial · 0.45mm/px · z∈[-90,-56]mm · 3 of 85 slices shown]
[im 9/85  bone]
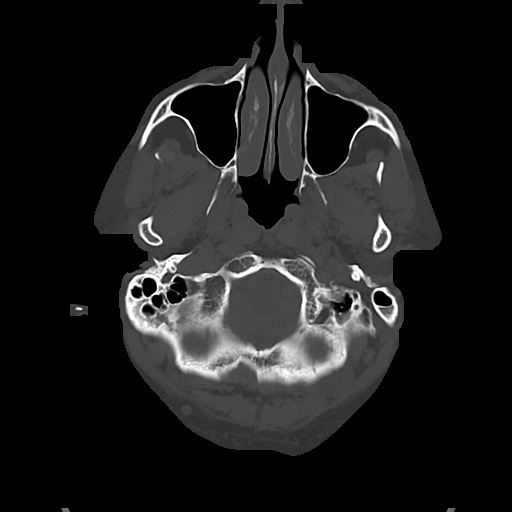
[im 17/85  bone]
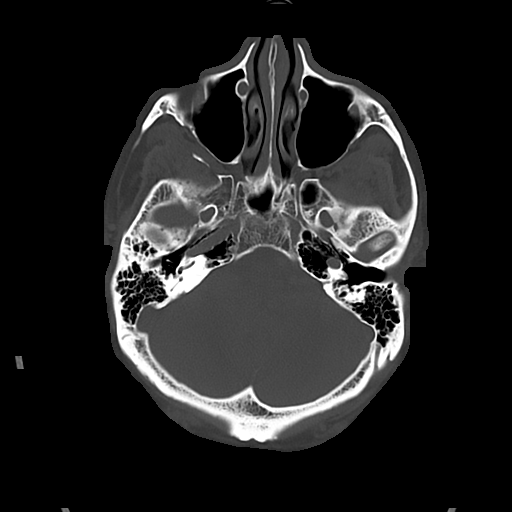
[im 26/85  bone]
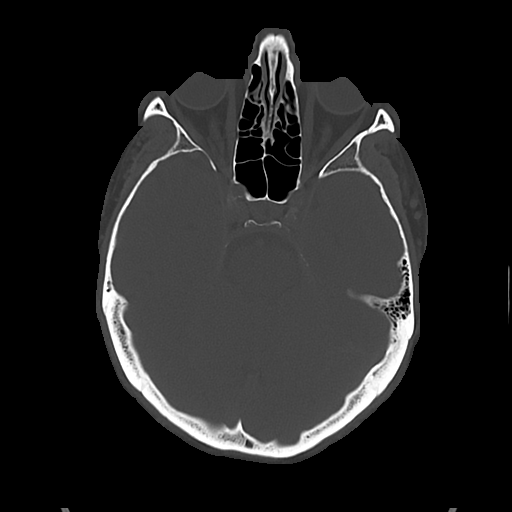

[Series 5: cor soft · coronal · 0.33mm/px · 3 of 73 slices shown]
[im 25/73  brain]
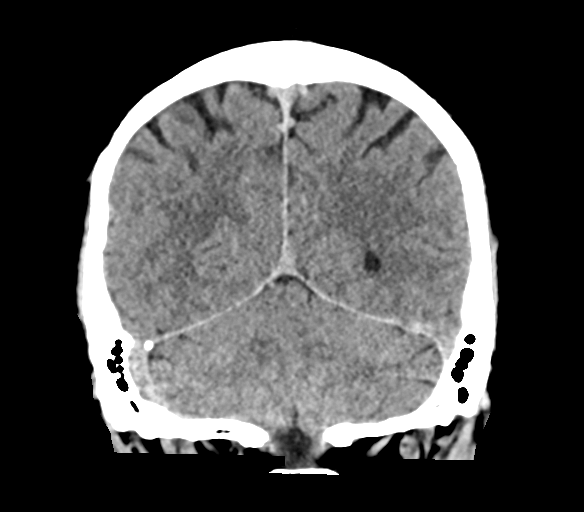
[im 33/73  brain]
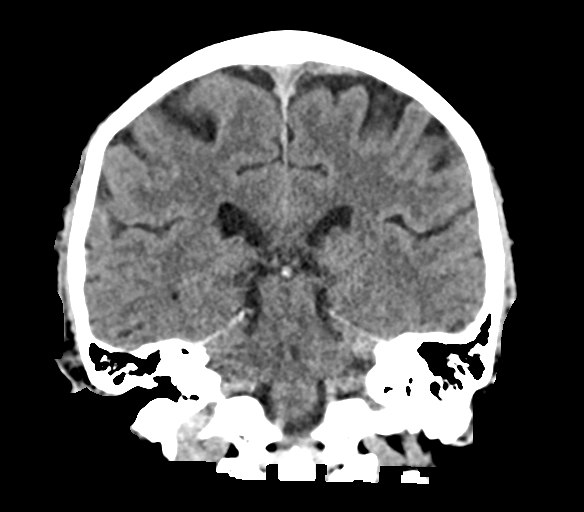
[im 41/73  brain]
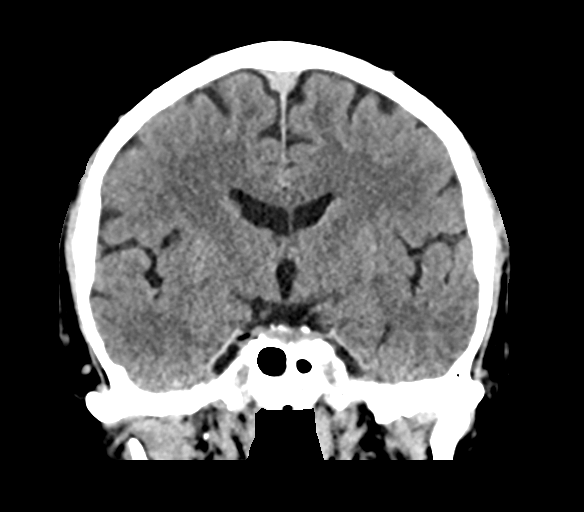

[Series 6: sag soft · sagittal · 0.35mm/px · 3 of 64 slices shown]
[im 22/64  brain]
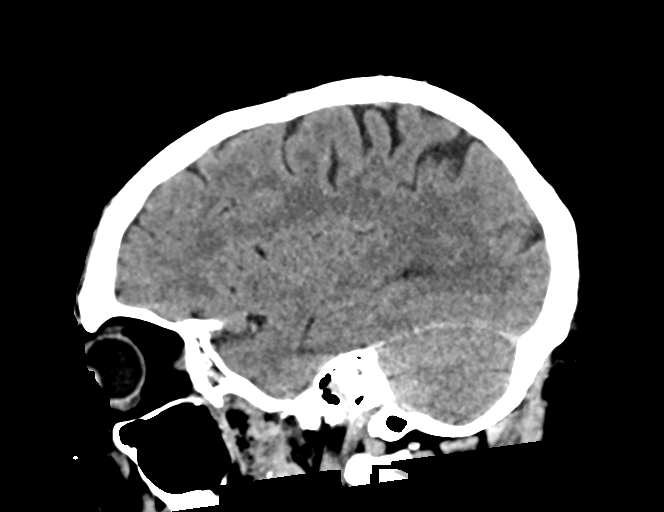
[im 32/64  brain]
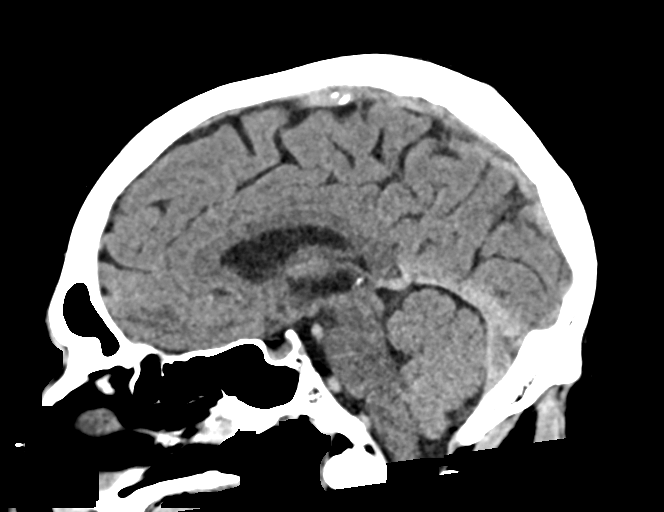
[im 43/64  brain]
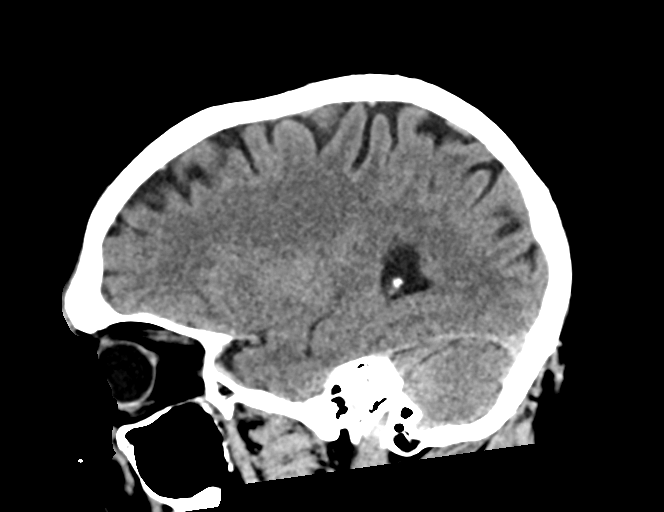

[16 of 47 positions shown; findings below may reference images not displayed]

FINDINGS: CT HEAD FINDINGS

Brain: Focal linear density in the left temporo-occipital region
along the superior aspect of the tentorium (series 3, image 11) also
appears to have been present on the prior CT and is therefore not
felt to reflect subarachnoid hemorrhage but instead may be vascular.
There is no evidence of intracranial hemorrhage elsewhere. No acute
infarct, mass, or extra-axial fluid collection is identified. The
ventricles and sulci are normal in size.

Vascular: Calcified atherosclerosis at the skull base. No hyperdense
vessel.

Skull: No fracture or focal osseous lesion.

Sinuses/Orbits: Unremarkable orbits. Visualized paranasal sinuses
and mastoid air cells are clear.

Other: None.

CT CERVICAL SPINE FINDINGS

Alignment: Normal.

Skull base and vertebrae: No fracture or suspicious osseous lesion.

Soft tissues and spinal canal: No prevertebral fluid or swelling. No
visible canal hematoma.

Disc levels: Mild cervical spondylosis. No evidence of high-grade
stenosis.

Upper chest: Clear lung apices.

Other: None.
IMPRESSION: 1. No evidence of acute intracranial abnormality.
2. No evidence of acute fracture or subluxation in the cervical
spine.

## 2022-05-13 ENCOUNTER — Ambulatory Visit (INDEPENDENT_AMBULATORY_CARE_PROVIDER_SITE_OTHER): Payer: BC Managed Care – PPO

## 2022-05-13 ENCOUNTER — Encounter: Payer: Self-pay | Admitting: *Deleted

## 2022-05-13 ENCOUNTER — Ambulatory Visit
Admission: EM | Admit: 2022-05-13 | Discharge: 2022-05-13 | Disposition: A | Discharge status: Home / Self Care | Payer: BC Managed Care – PPO

## 2022-05-13 DIAGNOSIS — R2241 Localized swelling, mass and lump, right lower limb: Secondary | ICD-10-CM | POA: Diagnosis not present

## 2022-05-13 DIAGNOSIS — M79672 Pain in left foot: Secondary | ICD-10-CM

## 2022-05-13 DIAGNOSIS — M79671 Pain in right foot: Secondary | ICD-10-CM | POA: Diagnosis not present

## 2022-05-13 MED ORDER — IBUPROFEN 800 MG PO TABS: 800.0000 mg | ORAL_TABLET | Freq: Three times a day (TID) | ORAL | 0 refills | Status: AC

## 2022-05-13 NOTE — Discharge Instructions (Signed)
The xray is negative for fracture or dislocation. Take medication as prescribed. Continue to apply ice to left foot. Apply for 20 minutes, remove for 1 hour then repeat. Weight bearing as tolerated. Use ACE wrap to help with compression and support. If symptoms do not improve over the next 5 to 7 days, follow-up with your PCP for further evaluation.

## 2022-05-13 NOTE — ED Triage Notes (Addendum)
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 Pt ambulates with limp. LLE CMS intact.

## 2022-05-13 NOTE — ED Provider Notes (Signed)
[facility]    CSN: [id number] Arrival date & time: 05/13/22  3329      History   Chief Complaint Chief Complaint  Patient presents with   Foot Pain    HPI Bryan Gibbs is a 66 y.o. male.   The history is provided by the patient.   Patient presents for complaints of left foot pain that started approximately 2 days ago.  Patient states pain is on the top of the left foot.  He states that he has 3 "knots" to the top of the left foot that are also painful to touch.  He describes the pain on his foot as "throbbing" in the area is also tender to palpation.  He does notice increased pain with walking.  He denies any known injury or trauma.  He states that he has pain that radiates through the bottom of the foot.  He denies numbness, tingling, or radiation of pain.  Patient states he has been taking Advil for his symptoms with good relief.  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 Past Medical History:  Diagnosis Date   Diabetes mellitus without complication (HCC)    Gout     There are no problems to display for this patient.   Past Surgical History:  Procedure Laterality Date   CLAVICLE SURGERY         Home Medications    Prior to Admission medications   Medication Sig Start Date End Date Taking? Authorizing Provider  ALLOPURINOL PO Take by mouth.   Yes [provider]  Dapagliflozin Propanediol (FARXIGA PO) Take by mouth.   Yes [provider]  GLIPIZIDE PO Take by mouth.   Yes [provider]  ibuprofen (ADVIL) 800 MG tablet Take 1 tablet (800 mg total) by mouth 3 (three) times daily. 05/13/22  Yes Marcos Peloso-Warren, Sadie Haber, NP  latanoprost (XALATAN) 0.005 % ophthalmic solution 1 drop at bedtime.   Yes [provider]  LISINOPRIL PO Take by mouth.   Yes [provider]  METFORMIN HCL PO Take by mouth.   Yes [provider]    Family  History Family History  Problem Relation Age of Onset   Heart attack Father        age 64    Social History Social History   Tobacco Use   Smoking status: Never   Smokeless tobacco: Never  Vaping Use   Vaping Use: Never used  Substance Use Topics   Alcohol use: No   Drug use: No     Allergies   Patient has no known allergies.   Review of Systems Review of Systems Per HPI  Physical Exam Triage Vital Signs ED Triage Vitals  Enc Vitals Group     BP 05/13/22 1007 125/72     Pulse Rate 05/13/22 1007 79     Resp 05/13/22 1007 18     Temp 05/13/22 1007 97.6 F (36.4 C)     Temp Source 05/13/22 1007 Oral     SpO2 05/13/22 1007 95 %     Weight --      Height --      Head Circumference --      Peak Flow --      Pain Score 05/13/22 1008 8     Pain Loc --      Pain Edu? --      Excl. in GC? --  No data found.  Updated Vital Signs BP 125/72   Pulse 79   Temp 97.6 F (36.4 C) (Oral)   Resp 18   SpO2 95%   Visual Acuity Right Eye Distance:   Left Eye Distance:   Bilateral Distance:    Right Eye Near:   Left Eye Near:    Bilateral Near:     Physical Exam Vitals and nursing note reviewed.  Constitutional:      General: He is not in acute distress.    Appearance: Normal appearance.  HENT:     Head: Normocephalic.  Eyes:     Extraocular Movements: Extraocular movements intact.     Pupils: Pupils are equal, round, and reactive to light.  Musculoskeletal:     Cervical back: Normal range of motion.     Right foot: Decreased range of motion. Normal capillary refill. Swelling (dorsal aspect of left foot from 2nd metatarsal to mid foot) and tenderness (dorsal aspect of left foot from 2nd metatarsal to midfoot) present. No deformity. Normal pulse.  Skin:    General: Skin is warm and dry.  Neurological:     General: No focal deficit present.     Mental Status: He is alert and oriented to person, place, and time.  Psychiatric:        Mood and Affect: Mood  normal.        Behavior: Behavior normal.      UC Treatments / Results  Labs (all labs ordered are listed, but only abnormal results are displayed) Labs Reviewed - No data to display  EKG   Radiology DG Foot Complete Left  Result Date: 05/13/2022 CLINICAL DATA:  66 year old male with history of pain, swelling and decreased range of motion in the left foot. EXAM: LEFT FOOT - COMPLETE 3+ VIEW COMPARISON:  No priors. FINDINGS: Three views of the left foot demonstrate no acute displaced fracture, subluxation or dislocation. There is a small plantar calcaneal enthesophyte. IMPRESSION: 1. No acute radiographic abnormality of the left foot. 2. Small plantar calcaneal spur incidentally noted. Electronically Signed   By: Trudie Reed M.D.   On: 05/13/2022 10:33    Procedures Procedures (including critical care time)  Medications Ordered in UC Medications - No data to display  Initial Impression / Assessment and Plan / UC Course  I have reviewed the triage vital signs and the nursing notes.  Pertinent labs & imaging results that were available during my care of the patient were reviewed by me and considered in my medical decision making (see chart for details).  2-day history of right foot pain without known injury or trauma.  Patient is well-appearing, he is in no acute distress, vital signs are stable.  X-rays are negative for fracture or dislocation.  Discussion with patient that it is difficult to ascertain the etiology of his symptoms based on his current presentation.  In the interim for his left foot pain and swelling, ibuprofen 800 mg was prescribed.  Supportive care recommendations were also provided for the patient to continue the use of ice and elevation.  Patient was given an Ace wrap to provide compression and support.  Patient was also provided a note for work.  Patient advised that if his symptoms do not improve over the next 5 to 7 days, to follow-up with his primary care  physician for further evaluation.  Patient verbalizes understanding.  All questions were answered.  Patient is stable for discharge. Final Clinical Impressions(s) / UC Diagnoses   Final diagnoses:  Right foot pain  Localized swelling of right foot     Discharge Instructions      The xray is negative for fracture or dislocation. Take medication as prescribed. Continue to apply ice to left foot. Apply for 20 minutes, remove for 1 hour then repeat. Weight bearing as tolerated. Use ACE wrap to help with compression and support. If symptoms do not improve over the next 5 to 7 days, follow-up with your PCP for further evaluation.      ED Prescriptions     Medication Sig Dispense Auth. Provider   ibuprofen (ADVIL) 800 MG tablet Take 1 tablet (800 mg total) by mouth 3 (three) times daily. 30 tablet Barney Russomanno-Warren, Sadie Haber, NP      PDMP not reviewed this encounter.   Abran Cantor, NP 05/13/22 1055

## 2022-06-21 DIAGNOSIS — H401111 Primary open-angle glaucoma, right eye, mild stage: Secondary | ICD-10-CM | POA: Diagnosis not present

## 2022-07-05 DIAGNOSIS — G4733 Obstructive sleep apnea (adult) (pediatric): Secondary | ICD-10-CM | POA: Diagnosis not present

## 2022-07-05 DIAGNOSIS — I10 Essential (primary) hypertension: Secondary | ICD-10-CM | POA: Diagnosis not present

## 2022-07-05 DIAGNOSIS — E7849 Other hyperlipidemia: Secondary | ICD-10-CM | POA: Diagnosis not present

## 2022-07-05 DIAGNOSIS — Z1329 Encounter for screening for other suspected endocrine disorder: Secondary | ICD-10-CM | POA: Diagnosis not present

## 2022-07-05 DIAGNOSIS — E1165 Type 2 diabetes mellitus with hyperglycemia: Secondary | ICD-10-CM | POA: Diagnosis not present

## 2022-07-10 DIAGNOSIS — M19042 Primary osteoarthritis, left hand: Secondary | ICD-10-CM | POA: Diagnosis not present

## 2022-07-10 DIAGNOSIS — E7849 Other hyperlipidemia: Secondary | ICD-10-CM | POA: Diagnosis not present

## 2022-07-10 DIAGNOSIS — I10 Essential (primary) hypertension: Secondary | ICD-10-CM | POA: Diagnosis not present

## 2022-07-10 DIAGNOSIS — M1000 Idiopathic gout, unspecified site: Secondary | ICD-10-CM | POA: Diagnosis not present

## 2022-07-10 DIAGNOSIS — Z23 Encounter for immunization: Secondary | ICD-10-CM | POA: Diagnosis not present

## 2022-07-10 DIAGNOSIS — G4733 Obstructive sleep apnea (adult) (pediatric): Secondary | ICD-10-CM | POA: Diagnosis not present

## 2022-07-10 DIAGNOSIS — M19041 Primary osteoarthritis, right hand: Secondary | ICD-10-CM | POA: Diagnosis not present

## 2022-07-10 DIAGNOSIS — E1165 Type 2 diabetes mellitus with hyperglycemia: Secondary | ICD-10-CM | POA: Diagnosis not present

## 2022-07-10 DIAGNOSIS — Z6833 Body mass index (BMI) 33.0-33.9, adult: Secondary | ICD-10-CM | POA: Diagnosis not present

## 2022-07-10 DIAGNOSIS — Z0001 Encounter for general adult medical examination with abnormal findings: Secondary | ICD-10-CM | POA: Diagnosis not present

## 2022-08-01 DIAGNOSIS — E1165 Type 2 diabetes mellitus with hyperglycemia: Secondary | ICD-10-CM | POA: Diagnosis not present

## 2022-08-01 DIAGNOSIS — I10 Essential (primary) hypertension: Secondary | ICD-10-CM | POA: Diagnosis not present

## 2022-08-01 DIAGNOSIS — E782 Mixed hyperlipidemia: Secondary | ICD-10-CM | POA: Diagnosis not present

## 2022-11-02 DIAGNOSIS — I10 Essential (primary) hypertension: Secondary | ICD-10-CM | POA: Diagnosis not present

## 2022-11-02 DIAGNOSIS — E782 Mixed hyperlipidemia: Secondary | ICD-10-CM | POA: Diagnosis not present

## 2022-11-02 DIAGNOSIS — E1165 Type 2 diabetes mellitus with hyperglycemia: Secondary | ICD-10-CM | POA: Diagnosis not present

## 2022-11-08 DIAGNOSIS — M19041 Primary osteoarthritis, right hand: Secondary | ICD-10-CM | POA: Diagnosis not present

## 2022-11-08 DIAGNOSIS — M19042 Primary osteoarthritis, left hand: Secondary | ICD-10-CM | POA: Diagnosis not present

## 2022-11-08 DIAGNOSIS — I10 Essential (primary) hypertension: Secondary | ICD-10-CM | POA: Diagnosis not present

## 2022-11-08 DIAGNOSIS — G4733 Obstructive sleep apnea (adult) (pediatric): Secondary | ICD-10-CM | POA: Diagnosis not present

## 2022-11-08 DIAGNOSIS — E7849 Other hyperlipidemia: Secondary | ICD-10-CM | POA: Diagnosis not present

## 2022-11-08 DIAGNOSIS — M1000 Idiopathic gout, unspecified site: Secondary | ICD-10-CM | POA: Diagnosis not present

## 2022-11-08 DIAGNOSIS — Z6833 Body mass index (BMI) 33.0-33.9, adult: Secondary | ICD-10-CM | POA: Diagnosis not present

## 2022-11-08 DIAGNOSIS — E1165 Type 2 diabetes mellitus with hyperglycemia: Secondary | ICD-10-CM | POA: Diagnosis not present

## 2022-11-15 DIAGNOSIS — G4733 Obstructive sleep apnea (adult) (pediatric): Secondary | ICD-10-CM | POA: Diagnosis not present

## 2022-12-04 DIAGNOSIS — E119 Type 2 diabetes mellitus without complications: Secondary | ICD-10-CM | POA: Diagnosis not present

## 2022-12-30 DIAGNOSIS — E782 Mixed hyperlipidemia: Secondary | ICD-10-CM | POA: Diagnosis not present

## 2022-12-30 DIAGNOSIS — E1165 Type 2 diabetes mellitus with hyperglycemia: Secondary | ICD-10-CM | POA: Diagnosis not present

## 2023-02-18 DIAGNOSIS — M1990 Unspecified osteoarthritis, unspecified site: Secondary | ICD-10-CM | POA: Diagnosis not present

## 2023-02-18 DIAGNOSIS — Z6834 Body mass index (BMI) 34.0-34.9, adult: Secondary | ICD-10-CM | POA: Diagnosis not present

## 2023-02-18 DIAGNOSIS — M109 Gout, unspecified: Secondary | ICD-10-CM | POA: Diagnosis not present

## 2023-02-18 DIAGNOSIS — I10 Essential (primary) hypertension: Secondary | ICD-10-CM | POA: Diagnosis not present

## 2023-02-18 DIAGNOSIS — Z833 Family history of diabetes mellitus: Secondary | ICD-10-CM | POA: Diagnosis not present

## 2023-02-18 DIAGNOSIS — Z7984 Long term (current) use of oral hypoglycemic drugs: Secondary | ICD-10-CM | POA: Diagnosis not present

## 2023-02-18 DIAGNOSIS — H409 Unspecified glaucoma: Secondary | ICD-10-CM | POA: Diagnosis not present

## 2023-02-18 DIAGNOSIS — E785 Hyperlipidemia, unspecified: Secondary | ICD-10-CM | POA: Diagnosis not present

## 2023-02-18 DIAGNOSIS — Z8249 Family history of ischemic heart disease and other diseases of the circulatory system: Secondary | ICD-10-CM | POA: Diagnosis not present

## 2023-02-18 DIAGNOSIS — E669 Obesity, unspecified: Secondary | ICD-10-CM | POA: Diagnosis not present

## 2023-02-18 DIAGNOSIS — G4733 Obstructive sleep apnea (adult) (pediatric): Secondary | ICD-10-CM | POA: Diagnosis not present

## 2023-02-18 DIAGNOSIS — E1151 Type 2 diabetes mellitus with diabetic peripheral angiopathy without gangrene: Secondary | ICD-10-CM | POA: Diagnosis not present

## 2023-02-25 DIAGNOSIS — E1165 Type 2 diabetes mellitus with hyperglycemia: Secondary | ICD-10-CM | POA: Diagnosis not present

## 2023-02-25 DIAGNOSIS — E039 Hypothyroidism, unspecified: Secondary | ICD-10-CM | POA: Diagnosis not present

## 2023-02-25 DIAGNOSIS — E782 Mixed hyperlipidemia: Secondary | ICD-10-CM | POA: Diagnosis not present

## 2023-02-25 DIAGNOSIS — R739 Hyperglycemia, unspecified: Secondary | ICD-10-CM | POA: Diagnosis not present

## 2023-03-06 DIAGNOSIS — G4733 Obstructive sleep apnea (adult) (pediatric): Secondary | ICD-10-CM | POA: Diagnosis not present

## 2023-03-06 DIAGNOSIS — M19042 Primary osteoarthritis, left hand: Secondary | ICD-10-CM | POA: Diagnosis not present

## 2023-03-06 DIAGNOSIS — E1165 Type 2 diabetes mellitus with hyperglycemia: Secondary | ICD-10-CM | POA: Diagnosis not present

## 2023-03-06 DIAGNOSIS — M1000 Idiopathic gout, unspecified site: Secondary | ICD-10-CM | POA: Diagnosis not present

## 2023-03-06 DIAGNOSIS — Z23 Encounter for immunization: Secondary | ICD-10-CM | POA: Diagnosis not present

## 2023-03-06 DIAGNOSIS — E7849 Other hyperlipidemia: Secondary | ICD-10-CM | POA: Diagnosis not present

## 2023-03-06 DIAGNOSIS — I10 Essential (primary) hypertension: Secondary | ICD-10-CM | POA: Diagnosis not present

## 2023-03-06 DIAGNOSIS — M19041 Primary osteoarthritis, right hand: Secondary | ICD-10-CM | POA: Diagnosis not present

## 2023-03-06 DIAGNOSIS — Z6832 Body mass index (BMI) 32.0-32.9, adult: Secondary | ICD-10-CM | POA: Diagnosis not present

## 2023-04-01 DIAGNOSIS — E1165 Type 2 diabetes mellitus with hyperglycemia: Secondary | ICD-10-CM | POA: Diagnosis not present

## 2023-04-01 DIAGNOSIS — E782 Mixed hyperlipidemia: Secondary | ICD-10-CM | POA: Diagnosis not present

## 2023-07-15 DIAGNOSIS — R030 Elevated blood-pressure reading, without diagnosis of hypertension: Secondary | ICD-10-CM | POA: Diagnosis not present

## 2023-07-15 DIAGNOSIS — M109 Gout, unspecified: Secondary | ICD-10-CM | POA: Diagnosis not present

## 2023-07-15 DIAGNOSIS — E782 Mixed hyperlipidemia: Secondary | ICD-10-CM | POA: Diagnosis not present

## 2023-07-15 DIAGNOSIS — Z0001 Encounter for general adult medical examination with abnormal findings: Secondary | ICD-10-CM | POA: Diagnosis not present

## 2023-07-15 DIAGNOSIS — E1165 Type 2 diabetes mellitus with hyperglycemia: Secondary | ICD-10-CM | POA: Diagnosis not present

## 2023-07-15 DIAGNOSIS — G4733 Obstructive sleep apnea (adult) (pediatric): Secondary | ICD-10-CM | POA: Diagnosis not present

## 2023-07-15 DIAGNOSIS — Z6833 Body mass index (BMI) 33.0-33.9, adult: Secondary | ICD-10-CM | POA: Diagnosis not present

## 2023-11-04 DIAGNOSIS — R030 Elevated blood-pressure reading, without diagnosis of hypertension: Secondary | ICD-10-CM | POA: Diagnosis not present

## 2023-11-04 DIAGNOSIS — E785 Hyperlipidemia, unspecified: Secondary | ICD-10-CM | POA: Diagnosis not present

## 2023-11-04 DIAGNOSIS — E669 Obesity, unspecified: Secondary | ICD-10-CM | POA: Diagnosis not present

## 2023-11-04 DIAGNOSIS — I10 Essential (primary) hypertension: Secondary | ICD-10-CM | POA: Diagnosis not present

## 2023-11-04 DIAGNOSIS — Z008 Encounter for other general examination: Secondary | ICD-10-CM | POA: Diagnosis not present

## 2023-11-04 DIAGNOSIS — E1169 Type 2 diabetes mellitus with other specified complication: Secondary | ICD-10-CM | POA: Diagnosis not present

## 2023-11-04 DIAGNOSIS — Z6834 Body mass index (BMI) 34.0-34.9, adult: Secondary | ICD-10-CM | POA: Diagnosis not present

## 2023-11-04 DIAGNOSIS — G4733 Obstructive sleep apnea (adult) (pediatric): Secondary | ICD-10-CM | POA: Diagnosis not present

## 2023-11-07 DIAGNOSIS — E7849 Other hyperlipidemia: Secondary | ICD-10-CM | POA: Diagnosis not present

## 2023-11-07 DIAGNOSIS — R5383 Other fatigue: Secondary | ICD-10-CM | POA: Diagnosis not present

## 2023-11-07 DIAGNOSIS — E1165 Type 2 diabetes mellitus with hyperglycemia: Secondary | ICD-10-CM | POA: Diagnosis not present

## 2023-11-07 DIAGNOSIS — I10 Essential (primary) hypertension: Secondary | ICD-10-CM | POA: Diagnosis not present

## 2023-11-14 DIAGNOSIS — G4733 Obstructive sleep apnea (adult) (pediatric): Secondary | ICD-10-CM | POA: Diagnosis not present

## 2023-11-14 DIAGNOSIS — E782 Mixed hyperlipidemia: Secondary | ICD-10-CM | POA: Diagnosis not present

## 2023-11-14 DIAGNOSIS — Z6832 Body mass index (BMI) 32.0-32.9, adult: Secondary | ICD-10-CM | POA: Diagnosis not present

## 2023-11-14 DIAGNOSIS — M109 Gout, unspecified: Secondary | ICD-10-CM | POA: Diagnosis not present

## 2023-11-14 DIAGNOSIS — Z23 Encounter for immunization: Secondary | ICD-10-CM | POA: Diagnosis not present

## 2023-11-14 DIAGNOSIS — E1165 Type 2 diabetes mellitus with hyperglycemia: Secondary | ICD-10-CM | POA: Diagnosis not present

## 2024-01-15 ENCOUNTER — Ambulatory Visit
Admission: EM | Admit: 2024-01-15 | Discharge: 2024-01-15 | Disposition: A | Discharge status: Home / Self Care | Attending: Family Medicine | Admitting: Family Medicine

## 2024-01-15 DIAGNOSIS — J069 Acute upper respiratory infection, unspecified: Secondary | ICD-10-CM | POA: Diagnosis not present

## 2024-01-15 LAB — POC COVID19/FLU A&B COMBO
Covid Antigen, POC: NEGATIVE
Influenza A Antigen, POC: NEGATIVE
Influenza B Antigen, POC: NEGATIVE

## 2024-01-15 MED ORDER — AZELASTINE HCL 0.1 % NA SOLN: 1.0000 | Freq: Two times a day (BID) | NASAL | 0 refills | Status: AC

## 2024-01-15 MED ORDER — PROMETHAZINE-DM 6.25-15 MG/5ML PO SYRP: 5.0000 mL | ORAL_SOLUTION | Freq: Four times a day (QID) | ORAL | 0 refills | Status: AC | PRN

## 2024-01-15 NOTE — ED Triage Notes (Signed)
 Pt reports cough, congestion, headache, body aches, x 2 days

## 2024-01-16 NOTE — ED Provider Notes (Signed)
 [facility]    CSN: [id number] Arrival date & time: 01/15/24  0813      History   Chief Complaint No chief complaint on file.   HPI Bryan Gibbs is a 68 y.o. male.   Patient presenting today with 2-day history of cough, congestion, headache, body aches.  Denies fever, body aches, chest pain, shortness of breath, abdominal pain, vomiting, diarrhea.  So far trying over-the-counter cold and congestion medication with minimal relief.  No known history of chronic pulmonary disease.  No known sick contacts recently.    Past Medical History:  Diagnosis Date   Diabetes mellitus without complication (HCC)    Gout     There are no active problems to display for this patient.   Past Surgical History:  Procedure Laterality Date   CLAVICLE SURGERY         Home Medications    Prior to Admission medications   Medication Sig Start Date End Date Taking? Authorizing Provider  azelastine  (ASTELIN ) 0.1 % nasal spray Place 1 spray into both nostrils 2 (two) times daily. Use in each nostril as directed 01/15/24  Yes Stuart Vernell Norris, PA-C  promethazine -dextromethorphan (PROMETHAZINE -DM) 6.25-15 MG/5ML syrup Take 5 mLs by mouth 4 (four) times daily as needed. 01/15/24  Yes Stuart Vernell Norris, PA-C  ALLOPURINOL PO Take by mouth.    [provider]  Dapagliflozin Propanediol (FARXIGA PO) Take by mouth.    [provider]  GLIPIZIDE PO Take by mouth.    [provider]  ibuprofen  (ADVIL ) 800 MG tablet Take 1 tablet (800 mg total) by mouth 3 (three) times daily. 05/13/22   Leath-Warren, Etta PARAS, NP  latanoprost (XALATAN) 0.005 % ophthalmic solution 1 drop at bedtime.    [provider]  LISINOPRIL PO Take by mouth.    [provider]  METFORMIN HCL PO Take by mouth.    [provider]    Family History Family History  Problem Relation Age of Onset   Heart attack Father        age 56    Social  History Social History   Tobacco Use   Smoking status: Never   Smokeless tobacco: Never  Vaping Use   Vaping status: Never Used  Substance Use Topics   Alcohol use: No   Drug use: No     Allergies   Patient has no known allergies.   Review of Systems Review of Systems Per HPI  Physical Exam Triage Vital Signs ED Triage Vitals  Encounter Vitals Group     BP 01/15/24 0825 115/71     Girls Systolic BP Percentile --      Girls Diastolic BP Percentile --      Boys Systolic BP Percentile --      Boys Diastolic BP Percentile --      Pulse Rate 01/15/24 0825 81     Resp 01/15/24 0825 20     Temp 01/15/24 0825 98.2 F (36.8 C)     Temp Source 01/15/24 0825 Oral     SpO2 01/15/24 0825 91 %     Weight --      Height --      Head Circumference --      Peak Flow --      Pain Score 01/15/24 0827 4     Pain Loc --      Pain Education --      Exclude from Growth Chart --    No data found.  Updated Vital Signs BP 115/71 (BP Location: Right Arm)   Pulse 81   Temp 98.2 F (36.8 C) (Oral)   Resp 20   SpO2 91%   Visual Acuity Right Eye Distance:   Left Eye Distance:   Bilateral Distance:    Right Eye Near:   Left Eye Near:    Bilateral Near:     Physical Exam Vitals and nursing note reviewed.  Constitutional:      Appearance: He is well-developed.  HENT:     Head: Atraumatic.     Right Ear: External ear normal.     Left Ear: External ear normal.     Nose: Rhinorrhea present.     Mouth/Throat:     Pharynx: Posterior oropharyngeal erythema present. No oropharyngeal exudate.  Eyes:     Conjunctiva/sclera: Conjunctivae normal.     Pupils: Pupils are equal, round, and reactive to light.  Cardiovascular:     Rate and Rhythm: Normal rate and regular rhythm.  Pulmonary:     Effort: Pulmonary effort is normal. No respiratory distress.     Breath sounds: No wheezing or rales.  Musculoskeletal:        General: Normal range of motion.     Cervical back: Normal  range of motion and neck supple.  Lymphadenopathy:     Cervical: No cervical adenopathy.  Skin:    General: Skin is warm and dry.  Neurological:     Mental Status: He is alert and oriented to person, place, and time.  Psychiatric:        Behavior: Behavior normal.      UC Treatments / Results  Labs (all labs ordered are listed, but only abnormal results are displayed) Labs Reviewed  POC COVID19/FLU A&B COMBO    EKG   Radiology No results found.  Procedures Procedures (including critical care time)  Medications Ordered in UC Medications - No data to display  Initial Impression / Assessment and Plan / UC Course  I have reviewed the triage vital signs and the nursing notes.  Pertinent labs & imaging results that were available during my care of the patient were reviewed by me and considered in my medical decision making (see chart for details).     Vital signs and exam reassuring today, rapid flu and COVID-negative.  Suspect viral respiratory infection.  Treat with Phenergan  DM, Astelin  nasal spray, supportive over-the-counter medications and home care.  Return for worsening symptoms.  Final Clinical Impressions(s) / UC Diagnoses   Final diagnoses:  Viral URI with cough   Discharge Instructions   None    ED Prescriptions     Medication Sig Dispense Auth. Provider   promethazine -dextromethorphan (PROMETHAZINE -DM) 6.25-15 MG/5ML syrup Take 5 mLs by mouth 4 (four) times daily as needed. 100 mL Stuart Vernell Norris, PA-C   azelastine  (ASTELIN ) 0.1 % nasal spray Place 1 spray into both nostrils 2 (two) times daily. Use in each nostril as directed 30 mL Stuart Vernell Norris, PA-C      PDMP not reviewed this encounter.   Stuart Vernell Norris, [place] 01/16/24 1732

## 2024-03-21 DIAGNOSIS — G4733 Obstructive sleep apnea (adult) (pediatric): Secondary | ICD-10-CM | POA: Diagnosis not present

## 2024-04-10 DIAGNOSIS — E7849 Other hyperlipidemia: Secondary | ICD-10-CM | POA: Diagnosis not present

## 2024-04-10 DIAGNOSIS — E1165 Type 2 diabetes mellitus with hyperglycemia: Secondary | ICD-10-CM | POA: Diagnosis not present

## 2024-04-14 DIAGNOSIS — Z6832 Body mass index (BMI) 32.0-32.9, adult: Secondary | ICD-10-CM | POA: Diagnosis not present

## 2024-04-14 DIAGNOSIS — E782 Mixed hyperlipidemia: Secondary | ICD-10-CM | POA: Diagnosis not present

## 2024-04-14 DIAGNOSIS — E1165 Type 2 diabetes mellitus with hyperglycemia: Secondary | ICD-10-CM | POA: Diagnosis not present

## 2024-04-14 DIAGNOSIS — G4733 Obstructive sleep apnea (adult) (pediatric): Secondary | ICD-10-CM | POA: Diagnosis not present

## 2024-04-14 DIAGNOSIS — M109 Gout, unspecified: Secondary | ICD-10-CM | POA: Diagnosis not present

## 2024-04-20 DIAGNOSIS — G4733 Obstructive sleep apnea (adult) (pediatric): Secondary | ICD-10-CM | POA: Diagnosis not present
